# Patient Record
Sex: Male | Born: 1955 | Race: Black or African American | Hispanic: No | Marital: Married | Smoking: Never smoker
Health system: Southern US, Community
[De-identification: ages and names within clinical notes are randomized; demographics above are authoritative.]

---

## 2014-06-05 ENCOUNTER — Emergency Department (HOSPITAL_COMMUNITY): Payer: Self-pay

## 2014-06-05 ENCOUNTER — Encounter (HOSPITAL_COMMUNITY): Payer: Self-pay | Admitting: Emergency Medicine

## 2014-06-05 ENCOUNTER — Emergency Department (HOSPITAL_COMMUNITY)
Admission: EM | Admit: 2014-06-05 | Discharge: 2014-06-05 | Disposition: A | Discharge status: Home / Self Care | Payer: Self-pay | Attending: Emergency Medicine | Admitting: Emergency Medicine

## 2014-06-05 DIAGNOSIS — J069 Acute upper respiratory infection, unspecified: Secondary | ICD-10-CM | POA: Insufficient documentation

## 2014-06-05 DIAGNOSIS — Z79899 Other long term (current) drug therapy: Secondary | ICD-10-CM | POA: Insufficient documentation

## 2014-06-05 MED ORDER — KETOROLAC TROMETHAMINE 60 MG/2ML IM SOLN
60.0000 mg | Freq: Once | INTRAMUSCULAR | Status: AC
Start: 2014-06-05 — End: 2014-06-05
  Administered 2014-06-05: 60 mg via INTRAMUSCULAR
  Filled 2014-06-05: qty 2

## 2014-06-05 MED ORDER — DEXAMETHASONE SODIUM PHOSPHATE 10 MG/ML IJ SOLN
10.0000 mg | Freq: Once | INTRAMUSCULAR | Status: AC
  Administered 2014-06-05: 10 mg via INTRAMUSCULAR
  Filled 2014-06-05: qty 1

## 2014-06-05 NOTE — ED Notes (Signed)
Patient reports cough and congestion for 2 days.  C/o headache on right side with neck and shoulder pain as well.  Patient is alert and oriented, ambulatory.

## 2014-06-05 NOTE — ED Notes (Signed)
Pt reports cough and congestion x 2 days. Having productive cough with thick sputum. Airway intact and no resp distress noted at triage.

## 2014-06-05 NOTE — ED Provider Notes (Signed)
I saw and evaluated the patient, reviewed the resident's note and I agree with the findings and plan.   .Face to face Exam:  General:  Awake HEENT:  Atraumatic Resp:  Normal effort Abd:  Nondistended Neuro:No focal weakness   Nelia Shiobert L Kaylyne Axton, MD 06/05/14 [phone]

## 2014-06-05 NOTE — Discharge Instructions (Signed)
Upper Respiratory Infection, Adult An upper respiratory infection (URI) is also sometimes known as the common cold. The upper respiratory tract includes the nose, sinuses, throat, trachea, and bronchi. Bronchi are the airways leading to the lungs. Most people improve within 1 week, but symptoms can last up to 2 weeks. A residual cough may last even longer.  CAUSES Many different viruses can infect the tissues lining the upper respiratory tract. The tissues become irritated and inflamed and often become very moist. Mucus production is also common. A cold is contagious. You can easily spread the virus to others by oral contact. This includes kissing, sharing a glass, coughing, or sneezing. Touching your mouth or nose and then touching a surface, which is then touched by another person, can also spread the virus. SYMPTOMS  Symptoms typically develop 1 to 3 days after you come in contact with a cold virus. Symptoms vary from person to person. They may include:  Runny nose.  Sneezing.  Nasal congestion.  Sinus irritation.  Sore throat.  Loss of voice (laryngitis).  Cough.  Fatigue.  Muscle aches.  Loss of appetite.  Headache.  Low-grade fever. DIAGNOSIS  You might diagnose your own cold based on familiar symptoms, since most people get a cold 2 to 3 times a year. Your caregiver can confirm this based on your exam. Most importantly, your caregiver can check that your symptoms are not due to another disease such as strep throat, sinusitis, pneumonia, asthma, or epiglottitis. Blood tests, throat tests, and X-rays are not necessary to diagnose a common cold, but they may sometimes be helpful in excluding other more serious diseases. Your caregiver will decide if any further tests are required. RISKS AND COMPLICATIONS  You may be at risk for a more severe case of the common cold if you smoke cigarettes, have chronic heart disease (such as heart failure) or lung disease (such as asthma), or if  you have a weakened immune system. The very young and very old are also at risk for more serious infections. Bacterial sinusitis, middle ear infections, and bacterial pneumonia can complicate the common cold. The common cold can worsen asthma and chronic obstructive pulmonary disease (COPD). Sometimes, these complications can require emergency medical care and may be life-threatening. PREVENTION  The best way to protect against getting a cold is to practice good hygiene. Avoid oral or hand contact with people with cold symptoms. Wash your hands often if contact occurs. There is no clear evidence that vitamin C, vitamin E, echinacea, or exercise reduces the chance of developing a cold. However, it is always recommended to get plenty of rest and practice good nutrition. TREATMENT  Treatment is directed at relieving symptoms. There is no cure. Antibiotics are not effective, because the infection is caused by a virus, not by bacteria. Treatment may include:  Increased fluid intake. Sports drinks offer valuable electrolytes, sugars, and fluids.  Breathing heated mist or steam (vaporizer or shower).  Eating chicken soup or other clear broths, and maintaining good nutrition.  Getting plenty of rest.  Using gargles or lozenges for comfort.  Controlling fevers with ibuprofen or acetaminophen as directed by your caregiver.  Increasing usage of your inhaler if you have asthma. Zinc gel and zinc lozenges, taken in the first 24 hours of the common cold, can shorten the duration and lessen the severity of symptoms. Pain medicines may help with fever, muscle aches, and throat pain. A variety of non-prescription medicines are available to treat congestion and runny nose. Your caregiver   can make recommendations and may suggest nasal or lung inhalers for other symptoms.  HOME CARE INSTRUCTIONS   Only take over-the-counter or prescription medicines for pain, discomfort, or fever as directed by your  caregiver.  Use a warm mist humidifier or inhale steam from a shower to increase air moisture. This may keep secretions moist and make it easier to breathe.  Drink enough water and fluids to keep your urine clear or pale yellow.  Rest as needed.  Return to work when your temperature has returned to normal or as your caregiver advises. You may need to stay home longer to avoid infecting others. You can also use a face mask and careful hand washing to prevent spread of the virus. SEEK MEDICAL CARE IF:   After the first few days, you feel you are getting worse rather than better.  You need your caregiver's advice about medicines to control symptoms.  You develop chills, worsening shortness of breath, or brown or red sputum. These may be signs of pneumonia.  You develop yellow or brown nasal discharge or pain in the face, especially when you bend forward. These may be signs of sinusitis.  You develop a fever, swollen neck glands, pain with swallowing, or white areas in the back of your throat. These may be signs of strep throat. SEEK IMMEDIATE MEDICAL CARE IF:   You have a fever.  You develop severe or persistent headache, ear pain, sinus pain, or chest pain.  You develop wheezing, a prolonged cough, cough up blood, or have a change in your usual mucus (if you have chronic lung disease).  You develop sore muscles or a stiff neck. Document Released: 02/06/2001 Document Revised: 11/05/2011 Document Reviewed: 11/18/2013 ExitCare Patient Information 2015 [employer]. This information is not intended to replace advice given to you by your health care provider. Make sure you discuss any questions you have with your health care provider.  

## 2014-06-05 NOTE — ED Notes (Signed)
Paged for room but no answer.
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
## 2014-06-07 LAB — CBG MONITORING, ED: Glucose-Capillary: 77 mg/dL (ref 70–99)

## 2015-03-28 DEATH — deceased

## 2017-08-31 ENCOUNTER — Emergency Department (HOSPITAL_COMMUNITY)
Admission: EM | Admit: 2017-08-31 | Discharge: 2017-08-31 | Disposition: A | Discharge status: Home / Self Care | Payer: No Typology Code available for payment source | Attending: Emergency Medicine | Admitting: Emergency Medicine

## 2017-08-31 ENCOUNTER — Emergency Department (HOSPITAL_COMMUNITY): Payer: No Typology Code available for payment source

## 2017-08-31 ENCOUNTER — Other Ambulatory Visit: Payer: Self-pay

## 2017-08-31 ENCOUNTER — Encounter (HOSPITAL_COMMUNITY): Payer: Self-pay

## 2017-08-31 DIAGNOSIS — Y92410 Unspecified street and highway as the place of occurrence of the external cause: Secondary | ICD-10-CM | POA: Diagnosis not present

## 2017-08-31 DIAGNOSIS — S060X0A Concussion without loss of consciousness, initial encounter: Secondary | ICD-10-CM | POA: Diagnosis not present

## 2017-08-31 DIAGNOSIS — Y9389 Activity, other specified: Secondary | ICD-10-CM | POA: Diagnosis not present

## 2017-08-31 DIAGNOSIS — Y998 Other external cause status: Secondary | ICD-10-CM | POA: Diagnosis not present

## 2017-08-31 DIAGNOSIS — R410 Disorientation, unspecified: Secondary | ICD-10-CM | POA: Insufficient documentation

## 2017-08-31 DIAGNOSIS — S20212A Contusion of left front wall of thorax, initial encounter: Secondary | ICD-10-CM

## 2017-08-31 DIAGNOSIS — S0990XA Unspecified injury of head, initial encounter: Secondary | ICD-10-CM | POA: Diagnosis present

## 2017-08-31 DIAGNOSIS — Z79899 Other long term (current) drug therapy: Secondary | ICD-10-CM | POA: Diagnosis not present

## 2017-08-31 MED ORDER — IBUPROFEN 600 MG PO TABS: 600.0000 mg | ORAL_TABLET | Freq: Four times a day (QID) | ORAL | 0 refills | Status: DC | PRN

## 2017-08-31 MED ORDER — IBUPROFEN 800 MG PO TABS: 800.0000 mg | ORAL_TABLET | Freq: Once | ORAL | Status: DC

## 2017-08-31 MED ORDER — CYCLOBENZAPRINE HCL 10 MG PO TABS: 5.0000 mg | ORAL_TABLET | Freq: Once | ORAL | Status: DC

## 2017-08-31 MED ORDER — CYCLOBENZAPRINE HCL 5 MG PO TABS: 5.0000 mg | ORAL_TABLET | Freq: Three times a day (TID) | ORAL | 0 refills | Status: DC | PRN
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
## 2017-08-31 NOTE — Discharge Instructions (Signed)
Take motrin for pain.   Take flexeril for muscle spasms.   Rest for 2-3 days.  See your doctor  Return to ER if you have worse dizziness, headaches, vomiting, chest pain, abdominal pain

## 2017-09-07 ENCOUNTER — Encounter (HOSPITAL_COMMUNITY): Payer: Self-pay

## 2017-09-07 ENCOUNTER — Emergency Department (HOSPITAL_COMMUNITY)
Admission: EM | Admit: 2017-09-07 | Discharge: 2017-09-07 | Disposition: A | Discharge status: Home / Self Care | Payer: No Typology Code available for payment source | Attending: Emergency Medicine | Admitting: Emergency Medicine

## 2017-09-07 ENCOUNTER — Other Ambulatory Visit: Payer: Self-pay

## 2017-09-07 DIAGNOSIS — S20212D Contusion of left front wall of thorax, subsequent encounter: Secondary | ICD-10-CM | POA: Diagnosis not present

## 2017-09-07 DIAGNOSIS — Z79899 Other long term (current) drug therapy: Secondary | ICD-10-CM | POA: Insufficient documentation

## 2017-09-07 DIAGNOSIS — R0789 Other chest pain: Secondary | ICD-10-CM | POA: Diagnosis present

## 2017-09-07 MED ORDER — METHOCARBAMOL 500 MG PO TABS: 500.0000 mg | ORAL_TABLET | Freq: Two times a day (BID) | ORAL | 0 refills | Status: AC

## 2017-09-07 MED ORDER — DICLOFENAC SODIUM 50 MG PO TBEC: 50.0000 mg | DELAYED_RELEASE_TABLET | Freq: Two times a day (BID) | ORAL | 0 refills | Status: AC

## 2017-09-07 NOTE — Discharge Instructions (Signed)
Do not take the muscle relaxer if driving as it will make you sleepy. Call [facility] to schedule a follow up appointment. Call on Monday since it may take several days before they have an opening.
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
## 2017-09-19 ENCOUNTER — Ambulatory Visit: Payer: Self-pay | Admitting: Family Medicine
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
## 2020-03-30 ENCOUNTER — Ambulatory Visit: Payer: Self-pay | Admitting: Family Medicine

## 2020-04-01 ENCOUNTER — Encounter: Payer: Self-pay | Admitting: Family Medicine

## 2020-05-09 ENCOUNTER — Ambulatory Visit: Payer: Self-pay | Admitting: Family Medicine

## 2024-10-08 ENCOUNTER — Ambulatory Visit: Payer: Self-pay | Admitting: Physician Assistant
